# Patient Record
Sex: Male | Born: 1985 | Race: White | Hispanic: No | Marital: Single | Smoking: Never smoker
Health system: Southern US, Community
[De-identification: ages and names within clinical notes are randomized; demographics above are authoritative.]

## PROBLEM LIST (undated history)

## (undated) HISTORY — PX: URETHRA SURGERY: SHX824

---
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
## 2014-07-29 ENCOUNTER — Emergency Department (HOSPITAL_COMMUNITY)
Admission: EM | Admit: 2014-07-29 | Discharge: 2014-07-29 | Disposition: A | Discharge status: Home / Self Care | Payer: BLUE CROSS/BLUE SHIELD | Attending: Emergency Medicine | Admitting: Emergency Medicine

## 2014-07-29 ENCOUNTER — Encounter (HOSPITAL_COMMUNITY): Payer: Self-pay | Admitting: Cardiology

## 2014-07-29 ENCOUNTER — Emergency Department (HOSPITAL_COMMUNITY): Payer: BLUE CROSS/BLUE SHIELD

## 2014-07-29 DIAGNOSIS — Y92410 Unspecified street and highway as the place of occurrence of the external cause: Secondary | ICD-10-CM | POA: Insufficient documentation

## 2014-07-29 DIAGNOSIS — Y998 Other external cause status: Secondary | ICD-10-CM | POA: Diagnosis not present

## 2014-07-29 DIAGNOSIS — M25531 Pain in right wrist: Secondary | ICD-10-CM

## 2014-07-29 DIAGNOSIS — Y9389 Activity, other specified: Secondary | ICD-10-CM | POA: Diagnosis not present

## 2014-07-29 DIAGNOSIS — S6991XA Unspecified injury of right wrist, hand and finger(s), initial encounter: Secondary | ICD-10-CM | POA: Diagnosis not present

## 2014-07-29 DIAGNOSIS — S0990XA Unspecified injury of head, initial encounter: Secondary | ICD-10-CM | POA: Diagnosis not present

## 2014-07-29 MED ORDER — ACETAMINOPHEN 325 MG PO TABS
650.0000 mg | ORAL_TABLET | Freq: Once | ORAL | Status: AC
  Administered 2014-07-29: 650 mg via ORAL
  Filled 2014-07-29: qty 2

## 2014-07-29 NOTE — ED Notes (Signed)
Declined W/C at D/C and was escorted to lobby by RN. 
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
## 2014-08-08 ENCOUNTER — Emergency Department (HOSPITAL_COMMUNITY)
Admission: EM | Admit: 2014-08-08 | Discharge: 2014-08-08 | Disposition: A | Discharge status: Home / Self Care | Payer: BLUE CROSS/BLUE SHIELD | Attending: Emergency Medicine | Admitting: Emergency Medicine

## 2014-08-08 ENCOUNTER — Encounter (HOSPITAL_COMMUNITY): Payer: Self-pay | Admitting: Nurse Practitioner

## 2014-08-08 DIAGNOSIS — M25531 Pain in right wrist: Secondary | ICD-10-CM | POA: Diagnosis present

## 2014-08-08 DIAGNOSIS — M778 Other enthesopathies, not elsewhere classified: Secondary | ICD-10-CM | POA: Insufficient documentation
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
## 2014-08-08 NOTE — Discharge Instructions (Signed)
Return to the emergency room with worsening of symptoms, new symptoms or with symptoms that are concerning, especially fevers, redness, swelling, unable to move fingers, numbness, tingling, weakness. RICE: Rest, Ice (three cycles of 20 mins on, off at least twice a day), compression/brace, elevation. Heating pad works well for back pain. Ibuprofen  (2 tablets ) every 5-6 hours for 3-5 days. Wear brace at night. Follow up with orthopedist if symptoms worsen or are persistent. Read below information and follow recommendations. Carpal Tunnel Syndrome Carpal tunnel syndrome is a disorder of the nervous system in the wrist that causes pain, hand weakness, and/or loss of feeling. Carpal tunnel syndrome is caused by the compression, stretching, or irritation of the median nerve at the wrist joint. Athletes who experience carpal tunnel syndrome may notice a decrease in their performance to the condition, especially for sports that require strong hand or wrist action.  SYMPTOMS   Tingling, numbness, or burning pain in the hand or fingers.  Inability to sleep due to pain in the hand.  Sharp pains that shoot from the wrist up the arm or to the fingers, especially at night.  Morning stiffness or cramping of the hand.  Thumb weakness, resulting in difficulty holding objects or making a fist.  Shiny, dry skin on the hand.  Reduced performance in any sport requiring a strong grip. CAUSES   Median nerve damage at the wrist is caused by pressure due to swelling, inflammation, or scarred tissue.  Sources of pressure include:  Repetitive gripping or squeezing that causes inflammation of the tendon sheaths.  Scarring or shortening of the ligament that covers the median nerve.  Traumatic injury to the wrist or forearm such as fracture, sprain, or dislocation.  Prolonged hyperextension (wrist bent backward) or hyperflexion (wrist bent downward) of the wrist. RISK INCREASES  WITH:  Diabetes mellitus.  Menopause or amenorrhea.  Rheumatoid arthritis.  Raynaud disease.  Pregnancy.  Gout.  Kidney disease.  Ganglion cyst.  Repetitive hand or wrist action.  Hypothyroidism (underactive thyroid gland).  Repetitive jolting or shaking of the hands or wrist.  Prolonged forceful weight-bearing on the hands. PREVENTION  Bracing the hand and wrist straight during activities that involve repetitive grasping.  For activities that require prolonged extension of the wrist (bending towards the top of the forearm) periodically change the position of your wrists.  Learn and use proper technique in activities that result in the wrist position in neutral to slight extension.  Avoid bending the wrist into full extension or flexion (up or down).  Keep the wrist in a straight (neutral) position. To keep the wrist in this position, wear a splint.  Avoid repetitive hand and wrist motions.  When possible avoid prolonged grasping of items (steering wheel of a car, a pen, a vacuum cleaner, or a rake).  Loosen your grip for activities that require prolonged grasping of items.  Place keyboards and writing surfaces at the correct height as to decrease strain on the wrist and hand.  Alternate work tasks to avoid prolonged wrist flexion.  Avoid pinching activities (needlework and writing) as they may irritate your carpal tunnel syndrome.  If these activities are necessary, complete them for shorter periods of time.  When writing, use a felt tip or rollerball pen and/or build up the grip on a pen to decrease the forces required for writing. PROGNOSIS  Carpal tunnel syndrome is usually curable with appropriate conservative treatment and sometimes resolves spontaneously. For some cases, surgery is necessary, especially if muscle  wasting or nerve changes have developed.  RELATED COMPLICATIONS   Permanent numbness and a weak thumb or fingers in the affected  hand.  Permanent paralysis of a portion of the hand and fingers. TREATMENT  Treatment initially consists of stopping activities that aggravate the symptoms as well as medication and ice to reduce inflammation. A wrist splint is often recommended for wear during activities of repetitive motion as well as at night. It is also important to learn and use proper technique when performing activities that typically cause pain. On occasion, a corticosteroid injection may be given. If symptoms persist despite conservative treatment, surgery may be an option. Surgical techniques free the pinched or compressed nerve. Carpal tunnel surgery is usually performed on an outpatient basis, meaning you go home the same day as surgery. These procedures provide almost complete relief of all symptoms in 95% of patients. Expect at least 2 weeks for healing after surgery. For cases that are the result of repeated jolting or shaking of the hand or wrist or prolonged hyperextension, surgery is not usually recommended because stretching of the median nerve, not compression, is usually the cause of carpal tunnel syndrome in these cases. MEDICATION   If pain medication is necessary, nonsteroidal anti-inflammatory medications, such as aspirin and ibuprofen, or other minor pain relievers, such as acetaminophen, are often recommended.  Do not take pain medication for 7 days before surgery.  Prescription pain relievers are usually only prescribed after surgery. Use only as directed and only as much as you need.  Corticosteroid injections may be given to reduce inflammation. However, they are not always recommended.  Vitamin B6 (pyridoxine) may reduce symptoms; use only if prescribed for your disorder. SEEK MEDICAL CARE IF:   Symptoms get worse or do not improve in 2 weeks despite treatment.  You also have a current or recent history of neck or shoulder injury that has resulted in pain or tingling elsewhere in your  arm. Document Released: 02/05/2005 Document Revised: 06/22/2013 Document Reviewed: 05/20/2008 [facility] Patient Information 2015 [place], [place]. This information is not intended to replace advice given to you by your health care provider. Make sure you discuss any questions you have with your health care provider.

## 2014-11-11 ENCOUNTER — Emergency Department (HOSPITAL_COMMUNITY): Payer: BLUE CROSS/BLUE SHIELD

## 2014-11-11 ENCOUNTER — Encounter (HOSPITAL_COMMUNITY): Payer: Self-pay | Admitting: *Deleted

## 2014-11-11 ENCOUNTER — Emergency Department (HOSPITAL_COMMUNITY)
Admission: EM | Admit: 2014-11-11 | Discharge: 2014-11-11 | Disposition: A | Discharge status: Home / Self Care | Payer: BLUE CROSS/BLUE SHIELD | Attending: Emergency Medicine | Admitting: Emergency Medicine

## 2014-11-11 DIAGNOSIS — Y9289 Other specified places as the place of occurrence of the external cause: Secondary | ICD-10-CM | POA: Diagnosis not present

## 2014-11-11 DIAGNOSIS — W2209XA Striking against other stationary object, initial encounter: Secondary | ICD-10-CM | POA: Insufficient documentation

## 2014-11-11 DIAGNOSIS — S51811A Laceration without foreign body of right forearm, initial encounter: Secondary | ICD-10-CM | POA: Diagnosis not present

## 2014-11-11 DIAGNOSIS — Z23 Encounter for immunization: Secondary | ICD-10-CM | POA: Insufficient documentation

## 2014-11-11 DIAGNOSIS — Y9389 Activity, other specified: Secondary | ICD-10-CM | POA: Insufficient documentation

## 2014-11-11 DIAGNOSIS — Y998 Other external cause status: Secondary | ICD-10-CM | POA: Insufficient documentation

## 2014-11-11 DIAGNOSIS — S59911A Unspecified injury of right forearm, initial encounter: Secondary | ICD-10-CM | POA: Diagnosis present

## 2014-11-11 LAB — CBC
HEMATOCRIT: 39.6 % (ref 39.0–52.0)
HEMOGLOBIN: 14.2 g/dL (ref 13.0–17.0)
MCH: 30.2 pg (ref 26.0–34.0)
MCHC: 35.9 g/dL (ref 30.0–36.0)
MCV: 84.3 fL (ref 78.0–100.0)
Platelets: 270 10*3/uL (ref 150–400)
RBC: 4.7 MIL/uL (ref 4.22–5.81)
RDW: 12.8 % (ref 11.5–15.5)
WBC: 11.1 10*3/uL — ABNORMAL HIGH (ref 4.0–10.5)

## 2014-11-11 LAB — ABO/RH: ABO/RH(D): A POS

## 2014-11-11 LAB — TYPE AND SCREEN
ABO/RH(D): A POS
Antibody Screen: NEGATIVE

## 2014-11-11 MED ORDER — TETANUS-DIPHTH-ACELL PERTUSSIS 5-2.5-18.5 LF-MCG/0.5 IM SUSP
0.5000 mL | Freq: Once | INTRAMUSCULAR | Status: AC
  Administered 2014-11-11: 0.5 mL via INTRAMUSCULAR
  Filled 2014-11-11: qty 0.5

## 2014-11-11 MED ORDER — CEPHALEXIN 500 MG PO CAPS: 500.0000 mg | ORAL_CAPSULE | Freq: Four times a day (QID) | ORAL | Status: DC

## 2014-11-11 MED ORDER — SODIUM CHLORIDE 0.9 % IV BOLUS (SEPSIS)
1000.0000 mL | Freq: Once | INTRAVENOUS | Status: AC
  Administered 2014-11-11: 1000 mL via INTRAVENOUS

## 2014-11-11 MED ORDER — CEFAZOLIN SODIUM 1-5 GM-% IV SOLN
1.0000 g | Freq: Once | INTRAVENOUS | Status: AC
  Administered 2014-11-11: 1 g via INTRAVENOUS
  Filled 2014-11-11: qty 50

## 2014-11-11 MED ORDER — OXYCODONE-ACETAMINOPHEN 5-325 MG PO TABS: 1.0000 | ORAL_TABLET | Freq: Four times a day (QID) | ORAL | Status: DC | PRN
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
## 2014-11-11 NOTE — ED Notes (Signed)
Pt unwilling to answer any triage questions at this time, requesting this RN leave the room so he can have a private conversation on the telephone. Attempts made to explain severity of injury, pt states he understands and needs privacy. Charge aware.

## 2014-11-11 NOTE — ED Notes (Signed)
Positive distal peripheral pulse on the right extremity

## 2014-11-11 NOTE — ED Notes (Signed)
Swelling to rt forearm continues; area is approx size of softball; Antony Madura, [place] and Dr Preston Fleeting in to see pt; upon examination of pt's arm the hematoma erupted and sprayed blood to the pt's face, neck and chest; Antony Madura, PA applied pressure dressing
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
## 2014-11-11 NOTE — Discharge Instructions (Signed)
Take Keflex as prescribed. You may take Percocet as needed for severe pain. Keep your right arm elevated with a sling as much as possible. Call Dr. Mina Marble to schedule a time to be seen in his office TODAY. Return to the ED as needed if symptoms worsen prior to or following follow up. DO NOT REMOVE YOUR DRESSING.
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
## 2016-07-24 ENCOUNTER — Ambulatory Visit (INDEPENDENT_AMBULATORY_CARE_PROVIDER_SITE_OTHER): Payer: BLUE CROSS/BLUE SHIELD | Admitting: Physician Assistant

## 2016-07-24 ENCOUNTER — Encounter: Payer: Self-pay | Admitting: Physician Assistant

## 2016-07-24 VITALS — BP 127/87 | HR 63 | Temp 98.3°F | Resp 17 | Ht 70.5 in | Wt 165.0 lb

## 2016-07-24 DIAGNOSIS — R1032 Left lower quadrant pain: Secondary | ICD-10-CM

## 2016-07-24 DIAGNOSIS — R197 Diarrhea, unspecified: Secondary | ICD-10-CM

## 2016-07-24 LAB — POCT CBC
Granulocyte percent: 74.5 % (ref 37–80)
HCT, POC: 41.3 % — AB (ref 43.5–53.7)
Hemoglobin: 14.6 g/dL (ref 14.1–18.1)
Lymph, poc: 2 (ref 0.6–3.4)
MCH, POC: 29.6 pg (ref 27–31.2)
MCHC: 35.4 g/dL (ref 31.8–35.4)
MCV: 83.6 fL (ref 80–97)
MID (cbc): 0.4 (ref 0–0.9)
MPV: 7.5 fL (ref 0–99.8)
POC Granulocyte: 7.1 — AB (ref 2–6.9)
POC LYMPH PERCENT: 21.1 % (ref 10–50)
POC MID %: 4.4 % (ref 0–12)
Platelet Count, POC: 314 10*3/uL (ref 142–424)
RBC: 4.94 M/uL (ref 4.69–6.13)
RDW, POC: 12.9 %
WBC: 9.5 10*3/uL (ref 4.6–10.2)

## 2016-07-24 NOTE — Patient Instructions (Addendum)
I will contact you with the results of your test when they come back.  Please come back to the clinic or go to the emergency department if your symptoms worsen: fever, shaking chills, no appetite, no bowel movements, vomiting/nausea.   We may advance to a CT scan if these results are negative and you are still experiencing symptoms.   Thank you for coming in today. I hope you feel we met your needs.  Feel free to call [facility] if you have any questions or further requests.  Please consider signing up for MyChart if you do not already have it, as this is a great way to communicate with me.  Best,  Whitney McVey, PA-C   Bland Diet A bland diet consists of foods that do not have a lot of fat or fiber. Foods without fat or fiber are easier for the body to digest. They are also less likely to irritate your mouth, throat, stomach, and other parts of your gastrointestinal tract. A bland diet is sometimes called a BRAT diet. What is my plan? Your health care provider or dietitian may recommend specific changes to your diet to prevent and treat your symptoms, such as:  Eating small meals often.  Cooking food until it is soft enough to chew easily.  Chewing your food well.  Drinking fluids slowly.  Not eating foods that are very spicy, sour, or fatty.  Not eating citrus fruits, such as oranges and grapefruit.  What do I need to know about this diet?  Eat a variety of foods from the bland diet food list.  Do not follow a bland diet longer than you have to.  Ask your health care provider whether you should take vitamins. What foods can I eat? Grains  Hot cereals, such as cream of wheat. Bread, crackers, or tortillas made from refined white flour. Rice. Vegetables Canned or cooked vegetables. Mashed or boiled potatoes. Fruits Bananas. Applesauce. Other types of cooked or canned fruit with the skin and seeds removed, such as canned peaches or pears. Meats and Other Protein  Sources Scrambled eggs. Creamy peanut butter or other nut butters. Lean, well-cooked meats, such as chicken or fish. Tofu. Soups or broths. Dairy Low-fat dairy products, such as milk, cottage cheese, or yogurt. Beverages Water. Herbal tea. Apple juice. Sweets and Desserts Pudding. Custard. Fruit gelatin. Ice cream. Fats and Oils Mild salad dressings. Canola or olive oil. The items listed above may not be a complete list of allowed foods or beverages. Contact your dietitian for more options. What foods are not recommended? Foods and ingredients that are often not recommended include:  Spicy foods, such as hot sauce or salsa.  Fried foods.  Sour foods, such as pickled or fermented foods.  Raw vegetables or fruits, especially citrus or berries.  Caffeinated drinks.  Alcohol.  Strongly flavored seasonings or condiments.  The items listed above may not be a complete list of foods and beverages that are not allowed. Contact your dietitian for more information. This information is not intended to replace advice given to you by your health care provider. Make sure you discuss any questions you have with your health care provider. Document Released: 05/30/2015 Document Revised: 07/14/2015 Document Reviewed: 02/17/2014 Elsevier Interactive Patient Education  2018 [employer].  IF you received an x-ray today, you will receive an invoice from [facility]. Please contact [facility] at [phone] with questions or concerns regarding your invoice.   IF you received labwork today, you will receive an invoice  from [facility]. Please contact [facility] at [phone] with questions or concerns regarding your invoice.   Our billing staff will not be able to assist you with questions regarding bills from these companies.  You will be contacted with the lab results as soon as they are available. The fastest way to get your results is to activate your My Chart account.  Instructions are located on the last page of this paperwork. If you have not heard from [place] regarding the results in 2 weeks, please contact this office.

## 2016-07-24 NOTE — Progress Notes (Signed)
Terry Daniels  MRN: [id number] DOB: 06/08/1985  PCP: Patient, No Pcp Per  Subjective:  Pt is a 31 year old male who presents to clinic for abdominal pain x 1 day.   Last night lower abdominal pain 5/10 pain - he leaned back to stretch and felt a pulling sensation LLQ. Worse with coughing. Describes his pain as pulling and pressure sensation. Pain is intermittent.  He woke up in the middle of the night last night with what he thought were "gas pains", he went to the bathroom and passed "yellowish-Silvera mucusy stools." He has had about 4-5 episodes of diarrhea with "yellowish-Brigante mucusy stool this morning. Denies fever, chills, n/v, back pain, skin changes.  H/o hernia as a child "in the groin area" Unknown laterality.  He still has his appendix.    Review of Systems  Constitutional: Negative for chills, diaphoresis, fatigue and fever.  Gastrointestinal: Positive for abdominal pain and diarrhea. Negative for abdominal distention, blood in stool, nausea and vomiting.  Psychiatric/Behavioral: Positive for sleep disturbance.    There are no active problems to display for this patient.   No current outpatient prescriptions on file prior to visit.   No current facility-administered medications on file prior to visit.     Allergies  Allergen Reactions  . Hydrocodone Other (See Comments)    Affects his ability to pee.  . Prednisone Other (See Comments)    Prefers not to take     Objective:  BP 127/87   Pulse 63   Temp 98.3 F (36.8 C) (Oral)   Resp 17   Ht 5' 10.5" (1.791 m)   Wt 165 lb (74.8 kg)   SpO2 98%   BMI 23.34 kg/m   Physical Exam  Constitutional: He is oriented to person, place, and time and well-developed, well-nourished, and in no distress. No distress.  Cardiovascular: Normal rate, regular rhythm and normal heart sounds.   Abdominal: Soft. Normal appearance and bowel sounds are normal. There is tenderness in the left lower quadrant. There is no rigidity, no  rebound and no guarding.  Neurological: He is alert and oriented to person, place, and time. GCS score is 15.  Skin: Skin is warm and dry.  Psychiatric: Mood, memory, affect and judgment normal.  Vitals reviewed.  Results for orders placed or performed in visit on 07/24/16  POCT CBC  Result Value Ref Range   WBC 9.5 4.6 - 10.2 K/uL   Lymph, poc 2.0 0.6 - 3.4   POC LYMPH PERCENT 21.1 10 - 50 %L   MID (cbc) 0.4 0 - 0.9   POC MID % 4.4 0 - 12 %M   POC Granulocyte 7.1 (A) 2 - 6.9   Granulocyte percent 74.5 37 - 80 %G   RBC 4.94 4.69 - 6.13 M/uL   Hemoglobin 14.6 14.1 - 18.1 g/dL   HCT, POC 56.241.3 (A) 13.043.5 - 53.7 %   MCV 83.6 80 - 97 fL   MCH, POC 29.6 27 - 31.2 pg   MCHC 35.4 31.8 - 35.4 g/dL   RDW, POC 86.512.9 %   Platelet Count, POC 314 142 - 424 K/uL   MPV 7.5 0 - 99.8 fL    Assessment and Plan :  1. Diarrhea, unspecified type 2. Left lower quadrant pain - Stool culture - Clostridium Difficile by PCR - POCT CBC - >3 episodes of diarrhea this morning. Stool cultures and C Diff are pending. Will contact with results. Advised pt to go to the emergency  department if his symptoms worsen. Will consider CT if stool is negative. Concern for possible diverticulitis or appendicitis, however PE was not impressive. CBC is pending. He agrees with plan.   Marco Collie, PA-C  [facility] at [facility] 07/24/2016 10:11 AM

## 2016-07-26 LAB — CLOSTRIDIUM DIFFICILE BY PCR: Toxigenic C. Difficile by PCR: NEGATIVE

## 2016-07-31 LAB — STOOL CULTURE: E coli, Shiga toxin Assay: NEGATIVE

## 2016-07-31 NOTE — Progress Notes (Signed)
Please call pt and let him know his stool culture results are negative.  Thank you! W
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
